# Patient Record
Sex: Male | Born: 1990 | Race: Black or African American | Hispanic: No | Marital: Married | Smoking: Current every day smoker
Health system: Southern US, Community
[De-identification: ages and names within clinical notes are randomized; demographics above are authoritative.]

---

## 2014-10-20 ENCOUNTER — Emergency Department (HOSPITAL_COMMUNITY)
Admission: EM | Admit: 2014-10-20 | Discharge: 2014-10-20 | Disposition: A | Discharge status: Home / Self Care | Payer: Self-pay | Attending: Emergency Medicine | Admitting: Emergency Medicine

## 2014-10-20 ENCOUNTER — Encounter (HOSPITAL_COMMUNITY): Payer: Self-pay | Admitting: Emergency Medicine

## 2014-10-20 DIAGNOSIS — Z720 Tobacco use: Secondary | ICD-10-CM | POA: Insufficient documentation

## 2014-10-20 DIAGNOSIS — Y9389 Activity, other specified: Secondary | ICD-10-CM | POA: Insufficient documentation

## 2014-10-20 DIAGNOSIS — R42 Dizziness and giddiness: Secondary | ICD-10-CM | POA: Insufficient documentation

## 2014-10-20 DIAGNOSIS — Y9289 Other specified places as the place of occurrence of the external cause: Secondary | ICD-10-CM | POA: Insufficient documentation

## 2014-10-20 DIAGNOSIS — Y998 Other external cause status: Secondary | ICD-10-CM | POA: Insufficient documentation

## 2014-10-20 DIAGNOSIS — W1839XA Other fall on same level, initial encounter: Secondary | ICD-10-CM | POA: Insufficient documentation

## 2014-10-20 DIAGNOSIS — R55 Syncope and collapse: Secondary | ICD-10-CM | POA: Insufficient documentation

## 2014-10-20 DIAGNOSIS — S79912A Unspecified injury of left hip, initial encounter: Secondary | ICD-10-CM | POA: Insufficient documentation

## 2014-10-20 LAB — URINALYSIS, ROUTINE W REFLEX MICROSCOPIC
BILIRUBIN URINE: NEGATIVE
GLUCOSE, UA: NEGATIVE mg/dL
HGB URINE DIPSTICK: NEGATIVE
Ketones, ur: NEGATIVE mg/dL
LEUKOCYTES UA: NEGATIVE
Nitrite: NEGATIVE
Protein, ur: NEGATIVE mg/dL
Specific Gravity, Urine: 1.01 (ref 1.005–1.030)
Urobilinogen, UA: 0.2 mg/dL (ref 0.0–1.0)
pH: 6 (ref 5.0–8.0)

## 2014-10-20 LAB — I-STAT CHEM 8, ED
BUN: 8 mg/dL (ref 6–23)
CHLORIDE: 105 meq/L (ref 96–112)
Calcium, Ion: 1.19 mmol/L (ref 1.12–1.23)
Creatinine, Ser: 0.9 mg/dL (ref 0.50–1.35)
Glucose, Bld: 98 mg/dL (ref 70–99)
HEMATOCRIT: 46 % (ref 39.0–52.0)
Hemoglobin: 15.6 g/dL (ref 13.0–17.0)
POTASSIUM: 4.2 mmol/L (ref 3.5–5.1)
SODIUM: 141 mmol/L (ref 135–145)
TCO2: 22 mmol/L (ref 0–100)

## 2014-10-20 LAB — RAPID URINE DRUG SCREEN, HOSP PERFORMED
Amphetamines: NOT DETECTED
BENZODIAZEPINES: NOT DETECTED
Barbiturates: NOT DETECTED
Cocaine: NOT DETECTED
Opiates: NOT DETECTED
Tetrahydrocannabinol: POSITIVE — AB

## 2014-10-20 MED ORDER — SODIUM CHLORIDE 0.9 % IV BOLUS (SEPSIS)
1000.0000 mL | Freq: Once | INTRAVENOUS | Status: AC
  Administered 2014-10-20: 1000 mL via INTRAVENOUS

## 2014-10-20 NOTE — ED Notes (Signed)
Urinal given

## 2014-10-20 NOTE — ED Provider Notes (Signed)
CSN: [id number]     Arrival date & time 10/20/14  0805 History   First MD Initiated Contact with Patient 10/20/14 [phone]     Chief Complaint  Patient presents with  . Loss of Consciousness     (Consider location/radiation/quality/duration/timing/severity/associated sxs/prior Treatment) The history is provided by the patient.     Patient presents with lightheadedness with fall and LOC that occurred during the night around 2:30/3am.  States he got up to urinate during the night, began to feel lightheaded and then began to fall.  States he remembers falling but thinks he passed out on the way down.  Has slight pain in his left hip and left 5th finger from fall.  Mild soreness of forehead without significant headache or focal neurologic deficits.  Denies CP, SOB.  Notes he usually eats and drinks well, does heavy lifting for a living.  Denies any bowel or bladder incontinence or tongue biting during episode.  No recent fevers or infections.  Denies alcohol or drug use.  Denies any recent medication use.    History reviewed. No pertinent past medical history. History reviewed. No pertinent past surgical history. History reviewed. No pertinent family history. History  Substance Use Topics  . Smoking status: Current Every Day Smoker -- 0.50 packs/day  . Smokeless tobacco: Not on file  . Alcohol Use: Not on file    Review of Systems  All other systems reviewed and are negative.     Allergies  Review of patient's allergies indicates no known allergies.  Home Medications   Prior to Admission medications   Not on File   BP 140/73 mmHg  Pulse 83  Temp(Src) 98.8 F (37.1 C) (Oral)  Resp 20  Ht  (1.651 m)  Wt 121 lb (54.885 kg)  BMI 20.14 kg/m2  SpO2 100% Physical Exam  Constitutional: He appears well-developed and well-nourished. No distress.  HENT:  Head: Normocephalic and atraumatic.  Neck: Neck supple.  Cardiovascular: Normal rate and regular rhythm.   Pulmonary/Chest:  Effort normal and breath sounds normal. No respiratory distress. He has no wheezes. He has no rales.  Abdominal: Soft. He exhibits no distension and no mass. There is no tenderness. There is no rebound and no guarding.  Musculoskeletal:       Left hip: He exhibits tenderness. He exhibits normal range of motion, normal strength, no swelling, no crepitus, no deformity and no laceration.       Left hand: Normal.       Legs: Neurological: He is alert. He has normal strength. No cranial nerve deficit. He exhibits normal muscle tone. Gait normal. GCS eye subscore is 4. GCS verbal subscore is 5. GCS motor subscore is 6.  Skin: He is not diaphoretic.  Nursing note and vitals reviewed.   ED Course  Procedures (including critical care time) Labs Review Labs Reviewed  URINE RAPID DRUG SCREEN (HOSP PERFORMED) - Abnormal; Notable for the following:    Tetrahydrocannabinol POSITIVE (*)    All other components within normal limits  URINALYSIS, ROUTINE W REFLEX MICROSCOPIC  I-STAT CHEM 8, ED    Imaging Review No results found.   EKG Interpretation   Date/Time:  Monday October 20 2014 08:24:50 EST Ventricular Rate:  64 PR Interval:  144 QRS Duration: 73 QT Interval:  374 QTC Calculation: 386 R Axis:   99 Text Interpretation:  Sinus arrhythmia Consider right ventricular  hypertrophy ST elev, probable normal early repol pattern Abnormal ekg  Confirmed by BEATON  MD, ROBERT ([id number])  on 10/20/2014 8:53:46 AM      MDM   Final diagnoses:  Syncope, unspecified syncope type  Lightheadedness   Afebrile, nontoxic patient with episode of syncope preceded by lightheadedness.  He remembers falling.  No e/o significant injury.  No neurologic complaints or headache.  EKG unremarkable.  Cardiac monitoring without events.  UA negative, urine drug remarkable for THC only, chem 8 unremarkable. Pt given IVF.  Doubt cardiogenic syncope.  D/C home with PCP resources for follow up.  Discussed result, findings,  treatment, and follow up  with patient.  Pt given return precautions.  Pt verbalizes understanding and agrees with plan.        Trixie Dredgemily Lanyah Spengler, PA-C 10/20/14 1244  Nelia Shiobert L Beaton, MD 10/21/14 [phone]

## 2014-10-20 NOTE — ED Notes (Signed)
Pt reports he got up around 0300 to use restroom; when he came to, he said clock said 0330 and he had to crawl to bed due to weakness. Ambulated today without distress. States he hit head and right hip and hand painful. States he felt him self falling and could not catch himself. Denies drinking or using drugs last night. Has never happened to him before.

## 2014-10-20 NOTE — Discharge Instructions (Signed)
Read the information below.  You may return to the Emergency Department at any time for worsening condition or any new symptoms that concern you. ° ° °Emergency Department Resource Guide °1) Find a Doctor and Pay Out of Pocket °Although you won't have to find out who is covered by your insurance plan, it is a good idea to ask around and get recommendations. You will then need to call the office and see if the doctor you have chosen will accept you as a new patient and what types of options they offer for patients who are self-pay. Some doctors offer discounts or will set up payment plans for their patients who do not have insurance, but you will need to ask so you aren't surprised when you get to your appointment. ° °2) Contact Your Local Health Department °Not all health departments have doctors that can see patients for sick visits, but many do, so it is worth a call to see if yours does. If you don't know where your local health department is, you can check in your phone book. The CDC also has a tool to help you locate your state's health department, and many state websites also have listings of all of their local health departments. ° °3) Find a Walk-in Clinic °If your illness is not likely to be very severe or complicated, you may want to try a walk in clinic. These are popping up all over the country in pharmacies, drugstores, and shopping centers. They're usually staffed by nurse practitioners or physician assistants that have been trained to treat common illnesses and complaints. They're usually fairly quick and inexpensive. However, if you have serious medical issues or chronic medical problems, these are probably not your best option. ° °No Primary Care Doctor: °- Call [facility] at  832-8000 - they can help you locate a primary care doctor that  accepts your insurance, provides certain services, etc. °- Physician Referral Service- 1-800-533-3463 ° °Chronic Pain Problems: °Organization          Address  Phone   Notes  °[facility]  (336) 297-2271 Patients need to be referred by their primary care doctor.  ° °Medication Assistance: °Organization         Address  Phone   Notes  °[place] [facility] [address] °[place], [place] [address] (336) 641-8030 --Must be a resident of [place] °-- Must have NO insurance coverage whatsoever (no Medicaid/ Medicare, etc.) °-- The pt. MUST have a primary care doctor that directs their care regularly and follows them in the community °  °[facility]  (866) 331-1348   °[employer]  (888) 892-1162   ° °Agencies that provide inexpensive medical care: °Organization         Address  Phone   Notes  °[facility]  (336) 832-8035   °[facility]    (336) 832-7272   °[facility] [address] °[place], [place] [address] (336) 832-4777   °[facility] [address], °[place] (336) 271-4999   °[facility]    (336) 373-0678   °[facility]    (336) 272-1050   °[facility] ° [address], [place] Phone:  (336) 832-4444, Fax:  (336) 832-4440 Hours of Operation:  9 am - 6 pm, M-F.  Also accepts Medicaid/Medicare and self-pay.  °[facility] ° [address],    Phone: (336) 832-3150, Fax: (336) 832-3151. Hours of Operation:  8:30 am - 5:30 pm, M-F.  Also accepts Medicaid and self-pay.  °[facility] [place] [address], [place] Phone: (336) 878-6027   °[facility] [address], [place], [place] (336)723-1848, Ext. 123 Mondays & Thursdays: 7-9 AM.  First 15 patients are seen on a first come, first serve basis. °  ° °Medicaid-accepting [place] Providers: ° °Organization         Address  Phone   Notes  °[facility] [address], [place] (336) 641-2100 Also accepts self-pay patients.  °[facility] [address], [place] ° (336) 856-9996   °[facility] [address], [place] (336) 288-8857   °[facility] [address] [place] [address], [place] (336) 299-7000   °Veita Bland [address], [place]  ° (336) 373-1557 Only accepts [place] Access Medicaid patients after they have their name applied to their card.  ° °Self-Pay (no insurance) in [place]: ° °Organization         Address  Phone   Notes  °Sickle Cell Patients, [facility] [address], [place] (336) 832-1970   °[facility] [address], [place] (336) 832-4400   °[facility] ° [address], [place] (336) 992-4800   °[facility]/Dr. Osei-Bonsu ° [address] [place] [address], [place] or [address], [place] (336) 841-8500 Phone number for both [place] and [place] locations is the same.  °[facility] [address], [place] (336) 299-0000   °[facility] [address] [place] [address], [place] or [address] (336) 852-7530 °(336) 878-2260   °[facility] [address], [place] (336) 350-1642, phone; (336) 294-5005, fax Sees patients 1st and 3rd Saturday of every month.  Must not qualify for public or private insurance (i.e. Medicaid, Medicare, [place] Health Choice, Veterans' Benefits) • Household income should be no more than 200% of the poverty level •The clinic cannot treat you if you are pregnant or think you are pregnant • Sexually transmitted diseases are not treated at the clinic.  ° ° °Dental Care: °Organization         Address  Phone  Notes  °[facility] [address], [place] (336) 641-6152 Accepts children up to age 21 who are enrolled in Medicaid or [place] Health Choice; pregnant women with a Medicaid card; and  children who have applied for Medicaid or [place] Health Choice, but were declined, whose parents can pay a reduced fee at time of service.  °[facility] [place]  [address], [place] (336) 641-7733 Accepts children up to age 21 who are enrolled in Medicaid or [place] Health Choice; pregnant women with a Medicaid card; and children who have applied for Medicaid or [place] Health Choice, but were declined, whose parents can pay a reduced fee at time of service.  °[facility] ° [address], [place] (336) 641-4533 Patients are seen by appointment only. Walk-ins are not accepted. [facility] will see patients 18 years of age and older. °Monday - Tuesday (8am-5pm) °Most Wednesdays (8:30-5pm) °$30 per visit, cash only  °[facility] ° [address], [place] (336) 641-4533 Patients are seen by appointment only. Walk-ins are not accepted. [facility] will see patients 18 years of age and older. °One Wednesday Evening (Monthly: Volunteer Based).  $30 per visit, cash only  °[facility]  (919) 537-3737 for adults; Children under age 4, call Graduate Pediatric Dentistry at ([phone]) [phone]. Children aged 4-14, please call (919) [phone] to request a pediatric application. ° Dental services are provided in all areas of dental care including fillings, crowns and bridges, complete and partial dentures, implants, gum treatment, root canals, and extractions. Preventive care is also provided. Treatment is provided to both adults and children. °Patients are selected via a lottery and there is often a waiting list. °  °[facility] [address], °[place] ° (336) 763-8833 [email or web address] °  °[facility] [address], [place], [place] (336)723-1848, Ext. 123 Second and Fourth Thursday of each month, opens at 6:30 AM; Clinic ends at 9 AM.  Patients are seen on a first-come first-served  basis, and a limited number are seen during each clinic.  ° °[facility] ° [address], [place], [place] (336) 723-7904   Eligibility Requirements °You must have lived in [place], [place], or [place] counties for at least the last three months. °  You cannot be eligible for state or federal sponsored healthcare insurance, including Veterans Administration, Medicaid, or Medicare. °  You generally cannot be eligible for healthcare insurance through your employer.  °  How to apply: °Eligibility screenings are held every Tuesday and Wednesday afternoon from 1:00 pm until 4:00 pm. You do not need an appointment for the interview!  °[facility] [address], [place], [place] 336-631-2330   °[facility]  336-342-8273   °[facility]  336-703-3100   °[facility]  336-570-6415   ° °Behavioral Health Resources in the Community: °Intensive Outpatient Programs °Organization         Address  Phone  Notes  °[facility] [address], [place], [place] 336-878-6098   °[facility] [address], [place], [place] 336-832-9800   °[facility] [address], [place], [place] ° 336-882-2125   °[facility] [address],  °[place], [place] 1-800-853-5163 or 336-641-4981   °Substance Abuse Resources °Organization         Address  Phone  Notes  °[facility]  336-882-2125   °[facility]  336-784-9470   °[facility]  336-285-9073   °[facility]  336-845-3988   °Residential & Outpatient Substance Abuse Program  1-800-659-3381   °Psychological Services °Organization         Address  Phone  Notes  °[facility]  336- 832-9600   °[facility]  336- 378-7881   °[facility] [address], [place] 1-800-853-5163 or 336-641-4981   ° °Mobile Crisis Teams °Organization          Address  Phone  Notes  °[facility], Mobile Crisis Care Unit  1-877-626-1772   °Assertive °[facility] ° [address] [place], [place] 336-834-9664   °Sharon DeEsch [address] °[place] 336-554-5454   ° °Self-Help/Support Groups °Organization         Address  Phone             Notes  °[facility]  - variety of   support groups  336- 373-1402 Call for more information  °Narcotics Anonymous (NA), [facility] [address], °[place]  2 meetings at this location  ° °Residential Treatment Programs °Organization         Address  Phone  Notes  °[facility] [address],    °[place]  1-866-801-8205   °[facility] ° [address], [place], [place] 704-293-8524   °[facility] [address], [place] 336-845-3988 Admissions: 8am-3pm M-F  °[facility] [address]    °[place], [place] 336-841-1104   °[facility] [address], [place], [place] 336-379-7146   °[facility] [address]  °[place], [place] 336-285-9073   °[facility] Intensive Outpatient [address], [place], [place] 336-852-3033   °[facility] [address]  °[place], [place] 1-877-615-2722 or 336-784-9470   °[facility] ([facility] [address] [place], [place] 336-227-7417 Accepts Medicaid  °[facility] [address]  °[place] 1-800-659-3381 Substance Abuse/Addiction Treatment  ° °[place] Behavioral Health Resources °Organization         Address  Phone  Notes  °[facility]  (888) 581-9988   °Julie Brannon, PhD [address] [place], [place]   (336) 349-5553 or (336) 951-0000   °[facility]   [address] °[place], [place] (336) 349-4454   °[facility] [address], [place], [place] (336) 342-8316 Insurance/Medicaid/sponsorship  through [facility]  °[facility] [address]                                    [place], [place] (336) 342-8316 Therapy/tele-psych/case  °[facility] [address]  ° [place], [place] (336) 349-2233    °Dr. Arfeen  (336) 349-4544   °[facility] [place]  [employer] [place] [facility] 1) [address], [place] °2) [address], [place] °3)  [address], [place] (336) 349-3220 °(336) 342-7768 ° °(336) 342-8140   °[place] [facility] (336) 342-1394 or (336) 342-3537 (After Hours)    ° ° ° °

## 2015-06-22 ENCOUNTER — Encounter (HOSPITAL_COMMUNITY): Payer: Self-pay | Admitting: Emergency Medicine

## 2015-06-22 ENCOUNTER — Emergency Department (HOSPITAL_COMMUNITY): Payer: Self-pay

## 2015-06-22 DIAGNOSIS — J069 Acute upper respiratory infection, unspecified: Secondary | ICD-10-CM | POA: Insufficient documentation

## 2015-06-22 DIAGNOSIS — Z720 Tobacco use: Secondary | ICD-10-CM | POA: Insufficient documentation

## 2015-06-22 LAB — CBC WITH DIFFERENTIAL/PLATELET
Basophils Absolute: 0 10*3/uL (ref 0.0–0.1)
Basophils Relative: 1 % (ref 0–1)
Eosinophils Absolute: 0 10*3/uL (ref 0.0–0.7)
Eosinophils Relative: 0 % (ref 0–5)
HEMATOCRIT: 44.1 % (ref 39.0–52.0)
HEMOGLOBIN: 14.8 g/dL (ref 13.0–17.0)
LYMPHS PCT: 27 % (ref 12–46)
Lymphs Abs: 2.1 10*3/uL (ref 0.7–4.0)
MCH: 28.8 pg (ref 26.0–34.0)
MCHC: 33.6 g/dL (ref 30.0–36.0)
MCV: 86 fL (ref 78.0–100.0)
MONOS PCT: 15 % — AB (ref 3–12)
Monocytes Absolute: 1.2 10*3/uL — ABNORMAL HIGH (ref 0.1–1.0)
NEUTROS ABS: 4.5 10*3/uL (ref 1.7–7.7)
Neutrophils Relative %: 57 % (ref 43–77)
Platelets: 236 10*3/uL (ref 150–400)
RBC: 5.13 MIL/uL (ref 4.22–5.81)
RDW: 12.9 % (ref 11.5–15.5)
WBC: 7.8 10*3/uL (ref 4.0–10.5)

## 2015-06-22 LAB — COMPREHENSIVE METABOLIC PANEL
ALK PHOS: 71 U/L (ref 38–126)
ALT: 15 U/L — ABNORMAL LOW (ref 17–63)
AST: 22 U/L (ref 15–41)
Albumin: 4 g/dL (ref 3.5–5.0)
Anion gap: 10 (ref 5–15)
BILIRUBIN TOTAL: 0.6 mg/dL (ref 0.3–1.2)
BUN: 9 mg/dL (ref 6–20)
CO2: 25 mmol/L (ref 22–32)
Calcium: 9 mg/dL (ref 8.9–10.3)
Chloride: 95 mmol/L — ABNORMAL LOW (ref 101–111)
Creatinine, Ser: 1.25 mg/dL — ABNORMAL HIGH (ref 0.61–1.24)
GFR calc Af Amer: >60 mL/min (ref >60)
Glucose, Bld: 102 mg/dL — ABNORMAL HIGH (ref 65–99)
POTASSIUM: 3.9 mmol/L (ref 3.5–5.1)
Sodium: 130 mmol/L — ABNORMAL LOW (ref 135–145)
TOTAL PROTEIN: 7.6 g/dL (ref 6.5–8.1)

## 2015-06-22 MED ORDER — ACETAMINOPHEN 325 MG PO TABS
650.0000 mg | ORAL_TABLET | Freq: Once | ORAL | Status: AC
  Administered 2015-06-22: 650 mg via ORAL

## 2015-06-22 MED ORDER — ACETAMINOPHEN 325 MG PO TABS
ORAL_TABLET | ORAL | Status: DC
Start: 2015-06-22 — End: 2015-06-23
  Filled 2015-06-22: qty 2

## 2015-06-22 NOTE — ED Notes (Signed)
Pt. reports persistent productive cough , chest congestion , fever, chills and generalized body aches onset last Friday .

## 2015-06-23 ENCOUNTER — Emergency Department (HOSPITAL_COMMUNITY)
Admission: EM | Admit: 2015-06-23 | Discharge: 2015-06-23 | Disposition: A | Discharge status: Home / Self Care | Payer: Self-pay | Attending: Emergency Medicine | Admitting: Emergency Medicine

## 2015-06-23 DIAGNOSIS — J069 Acute upper respiratory infection, unspecified: Secondary | ICD-10-CM

## 2015-06-23 MED ORDER — IBUPROFEN 800 MG PO TABS
800.0000 mg | ORAL_TABLET | Freq: Once | ORAL | Status: AC
  Administered 2015-06-23: 800 mg via ORAL
  Filled 2015-06-23: qty 1

## 2015-06-23 MED ORDER — BENZONATATE 100 MG PO CAPS: 100.0000 mg | ORAL_CAPSULE | Freq: Three times a day (TID) | ORAL | Status: AC | PRN

## 2015-06-23 MED ORDER — IBUPROFEN 800 MG PO TABS: 800.0000 mg | ORAL_TABLET | Freq: Three times a day (TID) | ORAL | Status: DC | PRN

## 2015-06-23 NOTE — Discharge Instructions (Signed)
Upper Respiratory Infection, Adult An upper respiratory infection (URI) is also sometimes known as the common cold. The upper respiratory tract includes the nose, sinuses, throat, trachea, and bronchi. Bronchi are the airways leading to the lungs. Most people improve within 1 week, but symptoms can last up to 2 weeks. A residual cough may last even longer.  CAUSES Many different viruses can infect the tissues lining the upper respiratory tract. The tissues become irritated and inflamed and often become very moist. Mucus production is also common. A cold is contagious. You can easily spread the virus to others by oral contact. This includes kissing, sharing a glass, coughing, or sneezing. Touching your mouth or nose and then touching a surface, which is then touched by another person, can also spread the virus. SYMPTOMS  Symptoms typically develop 1 to 3 days after you come in contact with a cold virus. Symptoms vary from person to person. They may include:  Runny nose.  Sneezing.  Nasal congestion.  Sinus irritation.  Sore throat.  Loss of voice (laryngitis).  Cough.  Fatigue.  Muscle aches.  Loss of appetite.  Headache.  Low-grade fever. DIAGNOSIS  You might diagnose your own cold based on familiar symptoms, since most people get a cold 2 to 3 times a year. Your caregiver can confirm this based on your exam. Most importantly, your caregiver can check that your symptoms are not due to another disease such as strep throat, sinusitis, pneumonia, asthma, or epiglottitis. Blood tests, throat tests, and X-rays are not necessary to diagnose a common cold, but they may sometimes be helpful in excluding other more serious diseases. Your caregiver will decide if any further tests are required. RISKS AND COMPLICATIONS  You may be at risk for a more severe case of the common cold if you smoke cigarettes, have chronic heart disease (such as heart failure) or lung disease (such as asthma), or if  you have a weakened immune system. The very young and very old are also at risk for more serious infections. Bacterial sinusitis, middle ear infections, and bacterial pneumonia can complicate the common cold. The common cold can worsen asthma and chronic obstructive pulmonary disease (COPD). Sometimes, these complications can require emergency medical care and may be life-threatening. PREVENTION  The best way to protect against getting a cold is to practice good hygiene. Avoid oral or hand contact with people with cold symptoms. Wash your hands often if contact occurs. There is no clear evidence that vitamin C, vitamin E, echinacea, or exercise reduces the chance of developing a cold. However, it is always recommended to get plenty of rest and practice good nutrition. TREATMENT  Treatment is directed at relieving symptoms. There is no cure. Antibiotics are not effective, because the infection is caused by a virus, not by bacteria. Treatment may include:  Increased fluid intake. Sports drinks offer valuable electrolytes, sugars, and fluids.  Breathing heated mist or steam (vaporizer or shower).  Eating chicken soup or other clear broths, and maintaining good nutrition.  Getting plenty of rest.  Using gargles or lozenges for comfort.  Controlling fevers with ibuprofen or acetaminophen as directed by your caregiver.  Increasing usage of your inhaler if you have asthma. Zinc gel and zinc lozenges, taken in the first 24 hours of the common cold, can shorten the duration and lessen the severity of symptoms. Pain medicines may help with fever, muscle aches, and throat pain. A variety of non-prescription medicines are available to treat congestion and runny nose. Your caregiver   can make recommendations and may suggest nasal or lung inhalers for other symptoms.  HOME CARE INSTRUCTIONS   Only take over-the-counter or prescription medicines for pain, discomfort, or fever as directed by your  caregiver.  Use a warm mist humidifier or inhale steam from a shower to increase air moisture. This may keep secretions moist and make it easier to breathe.  Drink enough water and fluids to keep your urine clear or pale yellow.  Rest as needed.  Return to work when your temperature has returned to normal or as your caregiver advises. You may need to stay home longer to avoid infecting others. You can also use a face mask and careful hand washing to prevent spread of the virus. SEEK MEDICAL CARE IF:   After the first few days, you feel you are getting worse rather than better.  You need your caregiver's advice about medicines to control symptoms.  You develop chills, worsening shortness of breath, or brown or red sputum. These may be signs of pneumonia.  You develop yellow or brown nasal discharge or pain in the face, especially when you bend forward. These may be signs of sinusitis.  You develop a fever, swollen neck glands, pain with swallowing, or white areas in the back of your throat. These may be signs of strep throat. SEEK IMMEDIATE MEDICAL CARE IF:   You have a fever.  You develop severe or persistent headache, ear pain, sinus pain, or chest pain.  You develop wheezing, a prolonged cough, cough up blood, or have a change in your usual mucus (if you have chronic lung disease).  You develop sore muscles or a stiff neck. Document Released: 03/22/2001 Document Revised: 12/19/2011 Document Reviewed: 01/01/2014 Exit[employer] Patient Information 2015 [employer]. This information is not intended to replace advice given to you by your health care provider. Make sure you discuss any questions you have with your health care provider.  

## 2015-06-23 NOTE — ED Provider Notes (Signed)
This chart was scribed for  Darren Maw Kimbely Whiteaker, DO by Bethel Born, ED Scribe. This patient was seen in room B18C/B18C and the patient's care was started at 3:23 AM.  TIME SEEN: 3:23 AM   CHIEF COMPLAINT: Fever  HPI: Darren Pratt is a 24 y.o. male with no significant PMHx who presents to the Emergency Department complaining of subjective fever with onset 1 day ago. Associated symptoms include chills, cough productive of yellow sputum, body aches, S/T and chest congestion. No nasal congestion, ear pain, nausea, or vomiting. Pt denies known sick contact and recent international travel. He did have a flu shot last year. No rash. No tick bite.   ROS: See HPI Constitutional: Subjective fever  Eyes: no drainage  ENT: no runny nose   Cardiovascular:  no chest pain  Resp: no SOB  GI: no vomiting GU: no dysuria Integumentary: no rash  Allergy: no hives  Musculoskeletal: no leg swelling  Neurological: no slurred speech ROS otherwise negative  PAST MEDICAL HISTORY/PAST SURGICAL HISTORY:  History reviewed. No pertinent past medical history.  MEDICATIONS:  Prior to Admission medications   Medication Sig Start Date End Date Taking? Authorizing Provider  acetaminophen (TYLENOL) 500 MG tablet Take 1,000 mg by mouth every 6 (six) hours as needed for mild pain or fever.   Yes Historical Provider, MD  GuaiFENesin (ROBITUSSIN CHEST CONGESTION PO) Take 15 mLs by mouth daily as needed (cough).   Yes Historical Provider, MD    ALLERGIES:  No Known Allergies  SOCIAL HISTORY:  Social History  Substance Use Topics  . Smoking status: Current Every Day Smoker -- 0.00 packs/day  . Smokeless tobacco: Not on file  . Alcohol Use: Not on file    FAMILY HISTORY: No family history on file.  EXAM: BP 129/83 mmHg  Pulse 81  Temp(Src) 101.2 F (38.4 C) (Oral)  Resp 16  Ht  (1.651 m)  Wt 120 lb (54.432 kg)  BMI 19.97 kg/m2  SpO2 100% CONSTITUTIONAL: Alert and oriented and responds  appropriately to questions. Well-appearing; well-nourished, patient is febrile but nontoxic appearing, appears in no distress, well-hydrated HEAD: Normocephalic EYES: Conjunctivae clear, PERRL ENT: normal nose; no rhinorrhea; moist mucous membranes; pharynx without lesions noted No tonsillar hypertrophy or exudate. No trismus or drooling. Normal phonation. No uvular deviation. NECK: Supple, no meningismus, no LAD  CARD: RRR; S1 and S2 appreciated; no murmurs, no clicks, no rubs, no gallops RESP: Normal chest excursion without splinting or tachypnea; breath sounds clear and equal bilaterally; no wheezes, no rhonchi, no rales, no hypoxia or respiratory distress, speaking full sentences ABD/GI: Normal bowel sounds; non-distended; soft, non-tender, no rebound, no guarding, no peritoneal signs BACK:  The back appears normal and is non-tender to palpation, there is no CVA tenderness EXT: Normal ROM in all joints; non-tender to palpation; no edema; normal capillary refill; no cyanosis, no calf tenderness or swelling    SKIN: Normal color for age and race; warm NEURO: Moves all extremities equally, sensation to light touch intact diffusely, cranial nerves II through XII intact PSYCH: The patient's mood and manner are appropriate. Grooming and personal hygiene are appropriate.  MEDICAL DECISION MAKING: Patient here with likely viral URI. Cough, nasal congestion, sore throat and body aches. He is febrile in the emergency department but otherwise hemodynamically stable. Chest x-ray clear. Labs unremarkable with no leukocytosis. Recommended symptomatic treatment with alternating Tylenol and ibuprofen, rest, increase fluid intake. We'll discharge with prescription for Tessalon Perles to use as needed for cough. We'll  give him outpatient follow-up information as needed. Discussed return precautions. He verbalized understanding and is comfortable with this plan. Do not feel he needs further emergent workup at this  time.   I personally performed the services described in this documentation, which was scribed in my presence. The recorded information has been reviewed and is accurate.      Darren Maw Caellum Mancil, DO 06/23/15 [phone]

## 2015-06-23 NOTE — ED Notes (Signed)
E-sig not working, pt verbalized understanding of dc instructions and prescriptions 

## 2016-04-24 IMAGING — CR DG CHEST 2V
2 series · 2 of 2 positions shown · non-contrast
Comparison: None.

CLINICAL DATA: Fever and persistent productive cough 3 days.

EXAM:
CHEST  2 VIEW

[chest pa]
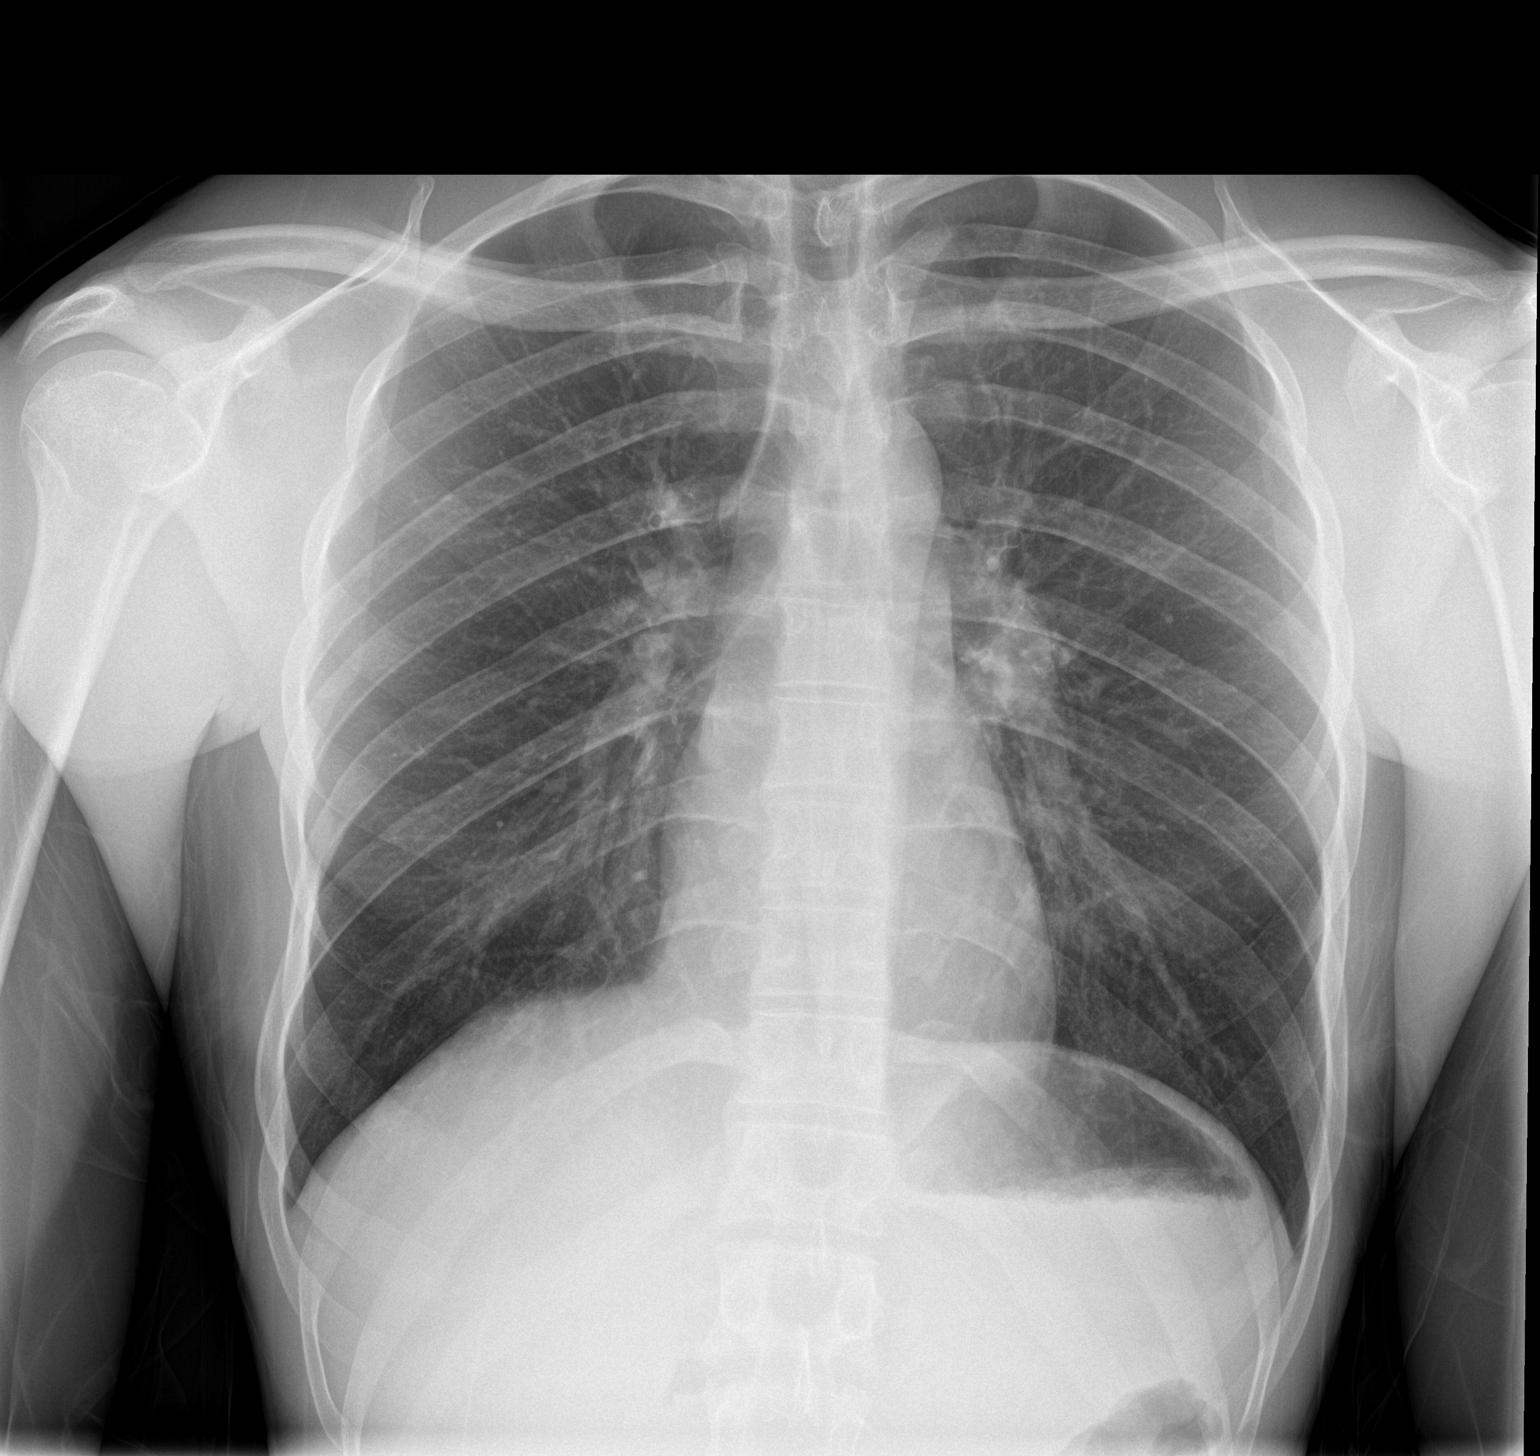

[chest lat]
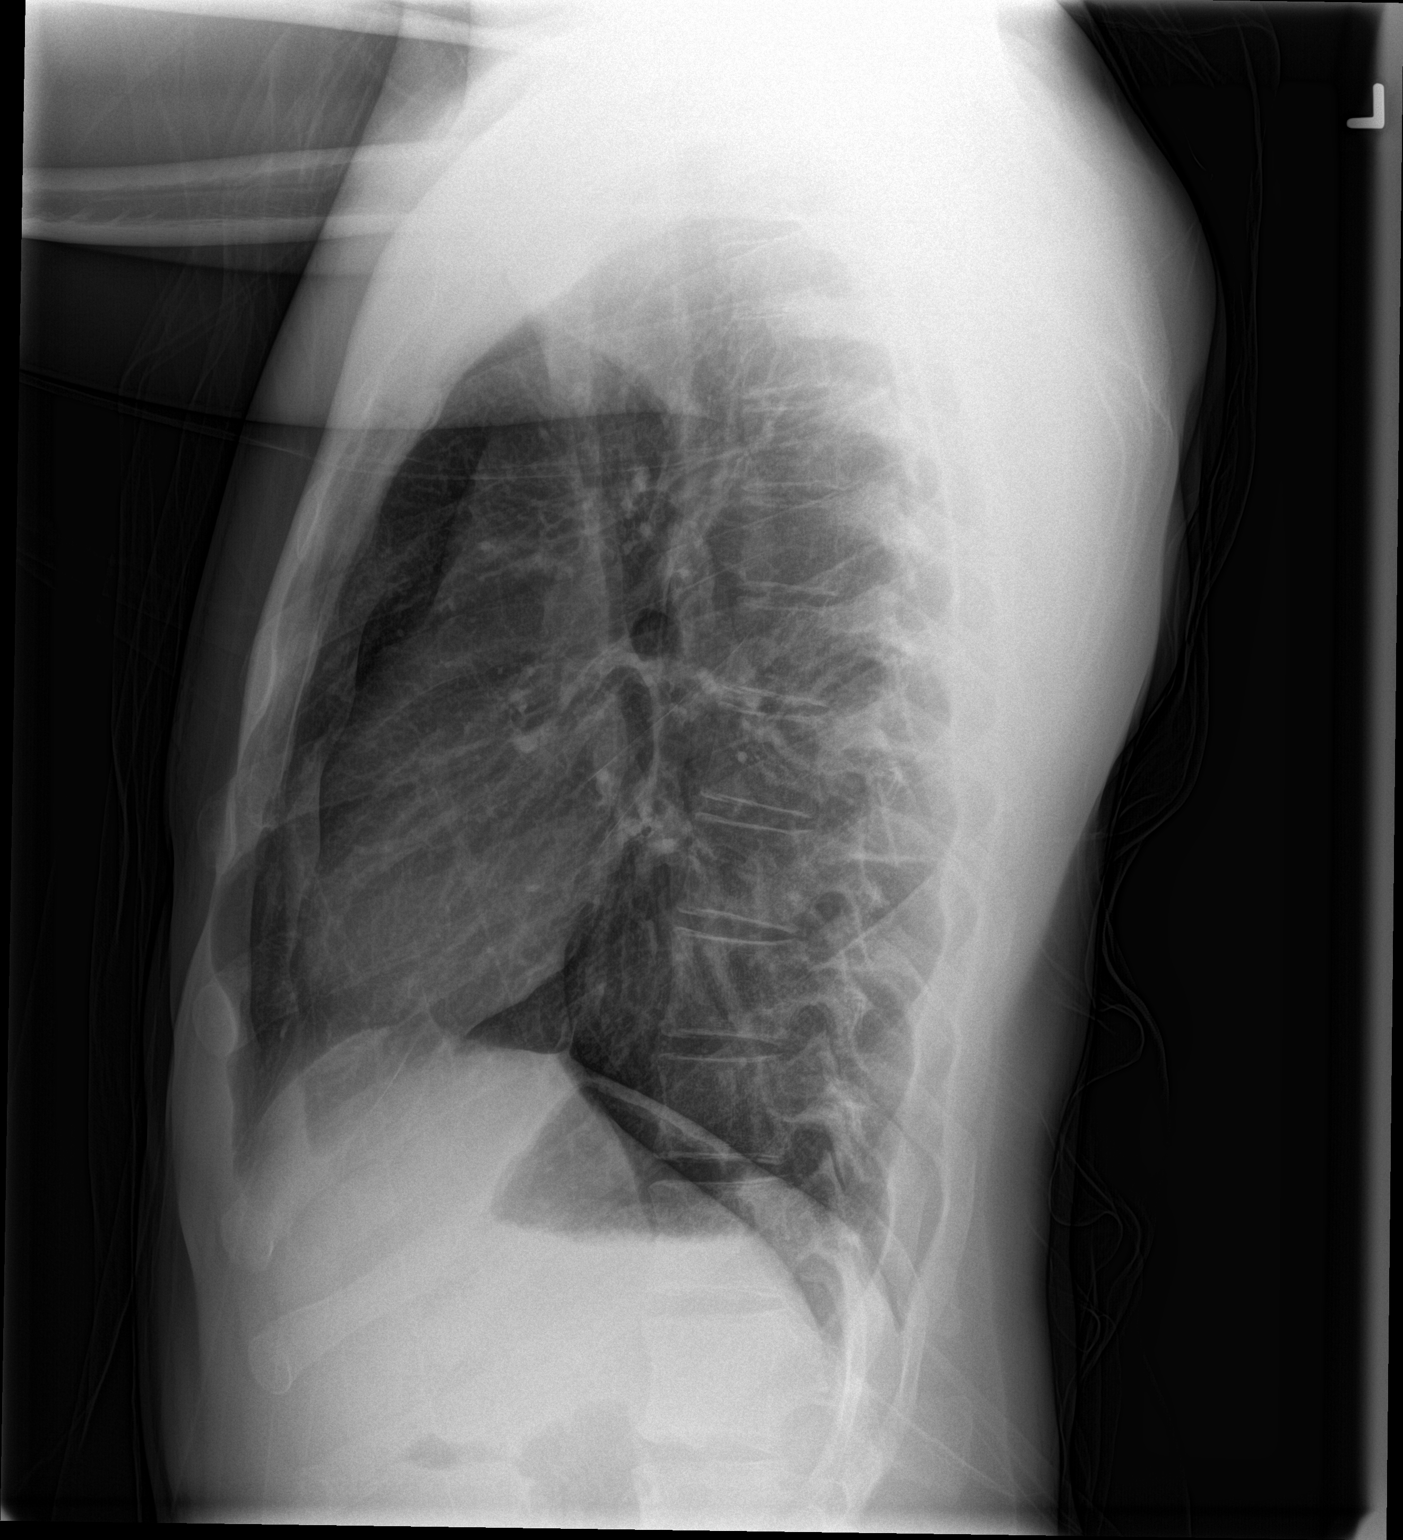

[2 of 2 positions shown; findings below may reference images not displayed]

FINDINGS: The heart size and mediastinal contours are within normal limits.
Both lungs are clear. The visualized skeletal structures are
unremarkable.
IMPRESSION: No active cardiopulmonary disease.

## 2023-06-07 ENCOUNTER — Emergency Department (HOSPITAL_COMMUNITY)
Admission: EM | Admit: 2023-06-07 | Discharge: 2023-06-07 | Disposition: A | Discharge status: Home / Self Care | Payer: 59 | Attending: Emergency Medicine | Admitting: Emergency Medicine

## 2023-06-07 ENCOUNTER — Encounter (HOSPITAL_COMMUNITY): Payer: Self-pay

## 2023-06-07 DIAGNOSIS — J020 Streptococcal pharyngitis: Secondary | ICD-10-CM | POA: Diagnosis not present

## 2023-06-07 DIAGNOSIS — Z20822 Contact with and (suspected) exposure to covid-19: Secondary | ICD-10-CM | POA: Diagnosis not present

## 2023-06-07 DIAGNOSIS — J029 Acute pharyngitis, unspecified: Secondary | ICD-10-CM | POA: Diagnosis present

## 2023-06-07 LAB — GROUP A STREP BY PCR: Group A Strep by PCR: DETECTED — AB

## 2023-06-07 LAB — SARS CORONAVIRUS 2 BY RT PCR: SARS Coronavirus 2 by RT PCR: NEGATIVE

## 2023-06-07 MED ORDER — IBUPROFEN 600 MG PO TABS: 600.0000 mg | ORAL_TABLET | Freq: Four times a day (QID) | ORAL | 0 refills | Status: AC | PRN

## 2023-06-07 MED ORDER — HYDROCODONE-ACETAMINOPHEN 5-325 MG PO TABS: 1.0000 | ORAL_TABLET | ORAL | 0 refills | Status: AC | PRN

## 2023-06-07 MED ORDER — AMOXICILLIN 500 MG PO CAPS
500.0000 mg | ORAL_CAPSULE | Freq: Once | ORAL | Status: AC
Start: 2023-06-07 — End: 2023-06-07
  Administered 2023-06-07: 500 mg via ORAL
  Filled 2023-06-07: qty 1

## 2023-06-07 MED ORDER — KETOROLAC TROMETHAMINE 30 MG/ML IJ SOLN
30.0000 mg | Freq: Once | INTRAMUSCULAR | Status: AC
  Administered 2023-06-07: 30 mg via INTRAMUSCULAR
  Filled 2023-06-07: qty 1

## 2023-06-07 MED ORDER — PREDNISONE 50 MG PO TABS: 50.0000 mg | ORAL_TABLET | Freq: Every day | ORAL | 0 refills | Status: AC

## 2023-06-07 MED ORDER — DEXAMETHASONE SODIUM PHOSPHATE 10 MG/ML IJ SOLN
10.0000 mg | Freq: Once | INTRAMUSCULAR | Status: AC
  Administered 2023-06-07: 10 mg via INTRAMUSCULAR
  Filled 2023-06-07: qty 1

## 2023-06-07 MED ORDER — AMOXICILLIN 500 MG PO CAPS: 500.0000 mg | ORAL_CAPSULE | Freq: Three times a day (TID) | ORAL | 0 refills | Status: AC

## 2023-06-07 NOTE — ED Provider Notes (Signed)
[place] EMERGENCY DEPARTMENT AT [facility] Provider Note   CSN: [id number] Arrival date & time: 06/07/23  4098     History  No chief complaint on file.   Darren Pratt is a 32 y.o. male.  Pt is a 32 yo male with no significant pmhx.  He has had a sore throat for a few days.  He has tried otc meds without relief.  He is able to drink. No fever.       Home Medications Prior to Admission medications   Medication Sig Start Date End Date Taking? Authorizing Provider  amoxicillin (AMOXIL) 500 MG capsule Take 1 capsule (500 mg total) by mouth 3 (three) times daily. 06/07/23  Yes Jacalyn Lefevre, MD  HYDROcodone-acetaminophen (NORCO/VICODIN) 5-325 MG tablet Take 1 tablet by mouth every 4 (four) hours as needed. 06/07/23  Yes Jacalyn Lefevre, MD  ibuprofen (ADVIL) 600 MG tablet Take 1 tablet (600 mg total) by mouth every 6 (six) hours as needed. 06/07/23  Yes Jacalyn Lefevre, MD  predniSONE (DELTASONE) 50 MG tablet Take 1 tablet (50 mg total) by mouth daily with breakfast. 06/07/23  Yes Jacalyn Lefevre, MD  acetaminophen (TYLENOL) 500 MG tablet Take 1,000 mg by mouth every 6 (six) hours as needed for mild pain or fever.    [provider]  benzonatate (TESSALON) 100 MG capsule Take 1 capsule (100 mg total) by mouth 3 (three) times daily as needed for cough. 06/23/15   Ward, Layla Maw, DO  GuaiFENesin (ROBITUSSIN CHEST CONGESTION PO) Take 15 mLs by mouth daily as needed (cough).    [provider]      Allergies    Patient has no known allergies.    Review of Systems   Review of Systems  HENT:  Positive for sore throat.   All other systems reviewed and are negative.   Physical Exam Updated Vital Signs BP 132/87   Pulse 90   Temp 98.3 F (36.8 C) (Oral)   Resp 18   SpO2 100%  Physical Exam Vitals and nursing note reviewed.  Constitutional:      Appearance: Normal appearance.  HENT:     Head: Normocephalic and atraumatic.     Right Ear: External  ear normal.     Left Ear: External ear normal.     Nose: Nose normal.     Mouth/Throat:     Mouth: Mucous membranes are moist.     Pharynx: Oropharyngeal exudate and posterior oropharyngeal erythema present.     Tonsils: No tonsillar abscesses.  Eyes:     Extraocular Movements: Extraocular movements intact.     Conjunctiva/sclera: Conjunctivae normal.     Pupils: Pupils are equal, round, and reactive to light.  Cardiovascular:     Rate and Rhythm: Normal rate and regular rhythm.     Pulses: Normal pulses.     Heart sounds: Normal heart sounds.  Pulmonary:     Effort: Pulmonary effort is normal.     Breath sounds: Normal breath sounds.  Abdominal:     General: Abdomen is flat. Bowel sounds are normal.     Palpations: Abdomen is soft.  Musculoskeletal:        General: Normal range of motion.  Lymphadenopathy:     Cervical: Cervical adenopathy present.  Skin:    General: Skin is warm.     Capillary Refill: Capillary refill takes less than 2 seconds.  Neurological:     General: No focal deficit present.     Mental Status:  He is alert and oriented to person, place, and time.  Psychiatric:        Mood and Affect: Mood normal.        Behavior: Behavior normal.     ED Results / Procedures / Treatments   Labs (all labs ordered are listed, but only abnormal results are displayed) Labs Reviewed  GROUP A STREP BY PCR - Abnormal; Notable for the following components:      Result Value   Group A Strep by PCR DETECTED (*)    All other components within normal limits  SARS CORONAVIRUS 2 BY RT PCR    EKG None  Radiology No results found.  Procedures Procedures    Medications Ordered in ED Medications  dexamethasone (DECADRON) injection 10 mg (10 mg Intramuscular Given 06/07/23 1020)  ketorolac (TORADOL) 30 MG/ML injection 30 mg (30 mg Intramuscular Given 06/07/23 1019)  amoxicillin (AMOXIL) capsule 500 mg (500 mg Oral Given 06/07/23 1210)    ED Course/ Medical Decision  Making/ A&P                                 Medical Decision Making Risk Prescription drug management.   This patient presents to the ED for concern of sore throat, this involves an extensive number of treatment options, and is a complaint that carries with it a high risk of complications and morbidity.  The differential diagnosis includes covid/strep/viral   Co morbidities that complicate the patient evaluation  none   Additional history obtained:  Additional history obtained from epic chart review  Lab Tests:  I Ordered, and personally interpreted labs.  The pertinent results include:  strep +; covid neg  Medicines ordered and prescription drug management:  I ordered medication including decadron/toradol/amox  for sx  Reevaluation of the patient after these medicines showed that the patient improved I have reviewed the patients home medicines and have made adjustments as needed   Problem List / ED Course:  Strep pharyngitis:  pt opted for oral abx.  He is started on amox.  He is stable for d/c.  Return if worse.  F/u with pcp.   Reevaluation:  After the interventions noted above, I reevaluated the patient and found that they have :improved   Social Determinants of Health:  Lives at home   Dispostion:  After consideration of the diagnostic results and the patients response to treatment, I feel that the patent would benefit from discharge with outpatient f/u.          Final Clinical Impression(s) / ED Diagnoses Final diagnoses:  Strep pharyngitis    Rx / DC Orders ED Discharge Orders          Ordered    amoxicillin (AMOXIL) 500 MG capsule  3 times daily        06/07/23 1151    HYDROcodone-acetaminophen (NORCO/VICODIN) 5-325 MG tablet  Every 4 hours PRN        06/07/23 1151    ibuprofen (ADVIL) 600 MG tablet  Every 6 hours PRN        06/07/23 1151    predniSONE (DELTASONE) 50 MG tablet  Daily with breakfast        06/07/23 1151               Jacalyn Lefevre, MD 06/07/23 1625

## 2023-06-07 NOTE — ED Triage Notes (Signed)
Pt c/o swollen tonsils since Monday morning. Pt has tried ibuprofen and gargling salt water with minimal pain relief. Pt reports swelling has continued to increase and pain is unbearable.
